# Patient Record
Sex: Female | Born: 1960 | Race: White | Hispanic: No | Marital: Married | Smoking: Current every day smoker
Health system: Southern US, Community
[De-identification: ages and names within clinical notes are randomized; demographics above are authoritative.]

## PROBLEM LIST (undated history)

## (undated) DIAGNOSIS — K219 Gastro-esophageal reflux disease without esophagitis: Secondary | ICD-10-CM

## (undated) DIAGNOSIS — K589 Irritable bowel syndrome without diarrhea: Secondary | ICD-10-CM

## (undated) DIAGNOSIS — I10 Essential (primary) hypertension: Secondary | ICD-10-CM

## (undated) DIAGNOSIS — K449 Diaphragmatic hernia without obstruction or gangrene: Secondary | ICD-10-CM

## (undated) HISTORY — PX: KNEE SURGERY: SHX244

## (undated) HISTORY — PX: FIBULA FRACTURE SURGERY: SHX947

## (undated) HISTORY — PX: ABDOMINAL HYSTERECTOMY: SHX81

---

## 2014-06-30 ENCOUNTER — Emergency Department (HOSPITAL_COMMUNITY): Payer: Medicare Other

## 2014-06-30 ENCOUNTER — Encounter (HOSPITAL_COMMUNITY): Payer: Self-pay

## 2014-06-30 ENCOUNTER — Emergency Department (HOSPITAL_COMMUNITY)
Admission: EM | Admit: 2014-06-30 | Discharge: 2014-06-30 | Disposition: A | Discharge status: Home / Self Care | Payer: Medicare Other | Attending: Emergency Medicine | Admitting: Emergency Medicine

## 2014-06-30 DIAGNOSIS — R110 Nausea: Secondary | ICD-10-CM | POA: Insufficient documentation

## 2014-06-30 DIAGNOSIS — Z7982 Long term (current) use of aspirin: Secondary | ICD-10-CM | POA: Diagnosis not present

## 2014-06-30 DIAGNOSIS — R1011 Right upper quadrant pain: Secondary | ICD-10-CM | POA: Insufficient documentation

## 2014-06-30 DIAGNOSIS — R1013 Epigastric pain: Secondary | ICD-10-CM | POA: Diagnosis present

## 2014-06-30 DIAGNOSIS — Z720 Tobacco use: Secondary | ICD-10-CM | POA: Diagnosis not present

## 2014-06-30 DIAGNOSIS — I10 Essential (primary) hypertension: Secondary | ICD-10-CM | POA: Diagnosis not present

## 2014-06-30 DIAGNOSIS — R197 Diarrhea, unspecified: Secondary | ICD-10-CM | POA: Insufficient documentation

## 2014-06-30 DIAGNOSIS — Z8719 Personal history of other diseases of the digestive system: Secondary | ICD-10-CM

## 2014-06-30 DIAGNOSIS — R109 Unspecified abdominal pain: Secondary | ICD-10-CM

## 2014-06-30 DIAGNOSIS — Z79899 Other long term (current) drug therapy: Secondary | ICD-10-CM | POA: Diagnosis not present

## 2014-06-30 DIAGNOSIS — Z90710 Acquired absence of both cervix and uterus: Secondary | ICD-10-CM | POA: Insufficient documentation

## 2014-06-30 HISTORY — DX: Gastro-esophageal reflux disease without esophagitis: K21.9

## 2014-06-30 HISTORY — DX: Diaphragmatic hernia without obstruction or gangrene: K44.9

## 2014-06-30 HISTORY — DX: Irritable bowel syndrome without diarrhea: K58.9

## 2014-06-30 HISTORY — DX: Essential (primary) hypertension: I10

## 2014-06-30 LAB — COMPREHENSIVE METABOLIC PANEL
ALK PHOS: 107 U/L (ref 39–117)
ALT: 42 U/L — ABNORMAL HIGH (ref 0–35)
AST: 32 U/L (ref 0–37)
Albumin: 3.7 g/dL (ref 3.5–5.2)
Anion gap: 6 (ref 5–15)
BUN: 15 mg/dL (ref 6–23)
CO2: 28 mmol/L (ref 19–32)
Calcium: 9.6 mg/dL (ref 8.4–10.5)
Chloride: 106 mmol/L (ref 96–112)
Creatinine, Ser: 0.65 mg/dL (ref 0.50–1.10)
GFR calc Af Amer: >90 mL/min (ref >90)
GFR calc non Af Amer: >90 mL/min (ref >90)
Glucose, Bld: 114 mg/dL — ABNORMAL HIGH (ref 70–99)
POTASSIUM: 3.8 mmol/L (ref 3.5–5.1)
SODIUM: 140 mmol/L (ref 135–145)
Total Bilirubin: 0.3 mg/dL (ref 0.3–1.2)
Total Protein: 6.7 g/dL (ref 6.0–8.3)

## 2014-06-30 LAB — CBC WITH DIFFERENTIAL/PLATELET
BASOS ABS: 0 10*3/uL (ref 0.0–0.1)
Basophils Relative: 0 % (ref 0–1)
EOS ABS: 0.2 10*3/uL (ref 0.0–0.7)
EOS PCT: 2 % (ref 0–5)
HCT: 44 % (ref 36.0–46.0)
HEMOGLOBIN: 14.6 g/dL (ref 12.0–15.0)
LYMPHS ABS: 2.6 10*3/uL (ref 0.7–4.0)
LYMPHS PCT: 24 % (ref 12–46)
MCH: 29.6 pg (ref 26.0–34.0)
MCHC: 33.2 g/dL (ref 30.0–36.0)
MCV: 89.1 fL (ref 78.0–100.0)
Monocytes Absolute: 1 10*3/uL (ref 0.1–1.0)
Monocytes Relative: 9 % (ref 3–12)
NEUTROS ABS: 7.1 10*3/uL (ref 1.7–7.7)
Neutrophils Relative %: 65 % (ref 43–77)
PLATELETS: 332 10*3/uL (ref 150–400)
RBC: 4.94 MIL/uL (ref 3.87–5.11)
RDW: 13.3 % (ref 11.5–15.5)
WBC: 10.9 10*3/uL — ABNORMAL HIGH (ref 4.0–10.5)

## 2014-06-30 LAB — URINALYSIS, ROUTINE W REFLEX MICROSCOPIC
Bilirubin Urine: NEGATIVE
GLUCOSE, UA: NEGATIVE mg/dL
Ketones, ur: NEGATIVE mg/dL
Leukocytes, UA: NEGATIVE
Nitrite: NEGATIVE
Protein, ur: NEGATIVE mg/dL
Urobilinogen, UA: 0.2 mg/dL (ref 0.0–1.0)
pH: 5.5 (ref 5.0–8.0)

## 2014-06-30 LAB — URINE MICROSCOPIC-ADD ON

## 2014-06-30 LAB — TROPONIN I

## 2014-06-30 LAB — LIPASE, BLOOD: LIPASE: 30 U/L (ref 11–59)

## 2014-06-30 MED ORDER — SODIUM CHLORIDE 0.45 % IV SOLN
INTRAVENOUS | Status: DC
  Administered 2014-06-30: 09:00:00 via INTRAVENOUS

## 2014-06-30 MED ORDER — ONDANSETRON HCL 8 MG PO TABS: 4.0000 mg | ORAL_TABLET | Freq: Three times a day (TID) | ORAL | Status: AC | PRN

## 2014-06-30 MED ORDER — GI COCKTAIL ~~LOC~~
ORAL | Status: AC
  Filled 2014-06-30: qty 30

## 2014-06-30 MED ORDER — GI COCKTAIL ~~LOC~~: 30.0000 mL | Freq: Once | ORAL | Status: DC

## 2014-06-30 MED ORDER — MORPHINE SULFATE 4 MG/ML IJ SOLN
4.0000 mg | Freq: Once | INTRAMUSCULAR | Status: AC
  Administered 2014-06-30: 4 mg via INTRAVENOUS
  Filled 2014-06-30: qty 1

## 2014-06-30 MED ORDER — FENTANYL CITRATE 0.05 MG/ML IJ SOLN
100.0000 ug | Freq: Once | INTRAMUSCULAR | Status: AC
  Administered 2014-06-30: 100 ug via INTRAVENOUS
  Filled 2014-06-30: qty 2

## 2014-06-30 MED ORDER — OXYCODONE-ACETAMINOPHEN 5-325 MG PO TABS: 1.0000 | ORAL_TABLET | ORAL | Status: AC | PRN

## 2014-06-30 MED ORDER — GI COCKTAIL ~~LOC~~
30.0000 mL | Freq: Once | ORAL | Status: AC
  Administered 2014-06-30: 30 mL via ORAL

## 2014-06-30 NOTE — ED Provider Notes (Signed)
Pt with epigastric pain onset at 4 AM - persistent, associated with excessive belching - no vomiting, no diarrhea, no CP - she states it feels different than her hiatal hernia in the past - tums and protonix without relief pta.  On exam has ttp in the epigstrium and RUQ c/w her pain - no reproducible CP and ECG is normal.  Check labs / lipase / CMP / Trop - GI cocktail.   EKG Interpretation  Date/Time:  Monday June 30 2014 08:26:50 EST Ventricular Rate:  76 PR Interval:  159 QRS Duration: 98 QT Interval:  403 QTC Calculation: 453 R Axis:   78 Text Interpretation:  Normal sinus rhythm Normal ECG No old tracing to compare Confirmed by Kischa Altice  MD, Adarrius Graeff ([id number]) on 06/30/2014 8:44:33 AM       Medical screening examination/treatment/procedure(s) were conducted as a shared visit with non-physician practitioner(s) and myself.  I personally evaluated the patient during the encounter.  Clinical Impression:   Final diagnoses:  Abdominal pain  H/O gastroesophageal reflux (GERD)         Vida RollerBrian D Ayeshia Coppin, MD 06/30/14 [phone]

## 2014-06-30 NOTE — ED Notes (Signed)
PA at bedside.

## 2014-06-30 NOTE — Discharge Instructions (Signed)
Abdominal Pain Many things can cause abdominal pain. Usually, abdominal pain is not caused by a disease and will improve without treatment. It can often be observed and treated at home. Your health care provider will do a physical exam and possibly order blood tests and X-rays to help determine the seriousness of your pain. However, in many cases, more time must pass before a clear cause of the pain can be found. Before that point, your health care provider may not know if you need more testing or further treatment. HOME CARE INSTRUCTIONS  Monitor your abdominal pain for any changes. The following actions may help to alleviate any discomfort you are experiencing:  Only take over-the-counter or prescription medicines as directed by your health care provider.  Do not take laxatives unless directed to do so by your health care provider.  Try a clear liquid diet (broth, tea, or water) as directed by your health care provider. Slowly move to a bland diet as tolerated. SEEK MEDICAL CARE IF:  You have unexplained abdominal pain.  You have abdominal pain associated with nausea or diarrhea.  You have pain when you urinate or have a bowel movement.  You experience abdominal pain that wakes you in the night.  You have abdominal pain that is worsened or improved by eating food.  You have abdominal pain that is worsened with eating fatty foods.  You have a fever. SEEK IMMEDIATE MEDICAL CARE IF:   Your pain does not go away within 2 hours.  You keep throwing up (vomiting).  Your pain is felt only in portions of the abdomen, such as the right side or the left lower portion of the abdomen.  You pass bloody or black tarry stools. MAKE SURE YOU:  Understand these instructions.   Will watch your condition.   Will get help right away if you are not doing well or get worse.  Document Released: 02/23/2005 Document Revised: 05/21/2013 Document Reviewed: 01/23/2013 [facility] Patient Information  2015 [place]. This information is not intended to replace advice given to you by your health care provider. Make sure you discuss any questions you have with your health care provider.  Use the medicines prescribed for your pain and nausea.  Do not drive within 4 hours of taking hydrocodone as it can make you drowsy.  Follow up with your doctor as planned.  As discussed,  You may benefit from having a HIDA scan study to rule out a dysfunctional gallbladder.  You do not have gallstones.  Your symptoms may also be due to increased gastritis/GERD from your recent antibiotic use.  Continue using your protonix and avoid any foods that worsen your symptoms.

## 2014-06-30 NOTE — ED Notes (Signed)
Pt c/o upper abd pain and pain under both breasts since 4 am.  Reports nausea, no vomiting.  Pt says has felt very tired for the past 2 weeks.   Has taken 6 tums and protonix this am without relief.

## 2014-06-30 NOTE — ED Provider Notes (Signed)
CSN: [id number]     Arrival date & time 06/30/14  0755 History   First MD Initiated Contact with Patient 06/30/14 0809     Chief Complaint  Patient presents with  . Abdominal Pain     (Consider location/radiation/quality/duration/timing/severity/associated sxs/prior Treatment) The history is provided by the patient and the spouse.   Charlotte Wilcox is a 54 y.o. female with a history significant for IBS, GERD, fatty liver and hiatal hernia presenting with epigastric to right upper quadrant sharp pain and pressure sensation with radiation into her right mid back which woke her at 3 AM today.  She endorses nausea without emesis.  Denies fevers or chills but did have fevers within the past week associated with an acute bronchitis for which she completed a Z-Pak 2 days ago.  She also endorses 3-4 episodes of nonbloody diarrhea this morning, but states this is normal for her IBS.  Her pain is constant, worsened with palpation, not worsened or improved with movement or positional changes.  She denies shortness of breath, but states her pain is worsened with deep inspiration. Also denies dysuria, flank pain, lower abdominal pain, palpitations, diaphoresis.  She has had no medications for her pain prior to arrival.  She reports a similar episode of symptoms 6 months ago at which time she was admitted to [facility] and she states her blood work testing for gallstones and cardiac disease were negative.  She does not recall if she had any imaging studies during this admission.  She has taken her protonix this am and also took 6 tums without relief.  Additionally she mentions completing a course of Flagyl 2 days ago for giardia exposure.     Past Medical History  Diagnosis Date  . IBS (irritable bowel syndrome)   . Hypertension   . GERD (gastroesophageal reflux disease)   . Hiatal hernia    Past Surgical History  Procedure Laterality Date  . Abdominal hysterectomy    . Knee surgery    . Fibula  fracture surgery     No family history on file. History  Substance Use Topics  . Smoking status: Current Every Day Smoker  . Smokeless tobacco: Not on file  . Alcohol Use: No   OB History    No data available     Review of Systems  Constitutional: Negative for fever.  HENT: Negative for congestion and sore throat.   Eyes: Negative.   Respiratory: Negative for chest tightness and shortness of breath.   Cardiovascular: Negative for chest pain and palpitations.  Gastrointestinal: Positive for nausea, abdominal pain and diarrhea. Negative for vomiting and constipation.  Genitourinary: Negative.  Negative for dysuria.  Musculoskeletal: Negative for joint swelling, arthralgias and neck pain.  Skin: Negative.  Negative for rash and wound.  Neurological: Negative for dizziness, weakness, light-headedness, numbness and headaches.  Psychiatric/Behavioral: Negative.       Allergies  Sulfa antibiotics and Saline  Home Medications   Prior to Admission medications   Medication Sig Start Date End Date Taking? Authorizing Provider  ALPRAZolam Prudy Feeler) 0.5 MG tablet Take 1 tablet by mouth daily as needed for anxiety or sleep.  06/24/14  Yes Historical Provider, MD  aspirin EC 81 MG tablet Take 81 mg by mouth daily.   Yes Historical Provider, MD  atorvastatin (LIPITOR) 80 MG tablet Take 1 tablet by mouth daily. 06/01/14  Yes Historical Provider, MD  calcium carbonate (TUMS - DOSED IN MG ELEMENTAL CALCIUM) 500 MG chewable tablet Chew 5 tablets by mouth  daily as needed for indigestion or heartburn.   Yes Historical Provider, MD  Cholecalciferol (VITAMIN D3) 2000 UNITS capsule Take 1 capsule by mouth daily. 05/05/14  Yes Historical Provider, MD  cyclobenzaprine (FLEXERIL) 10 MG tablet Take 1 tablet by mouth daily as needed for muscle spasms.  04/30/14  Yes Historical Provider, MD  dicyclomine (BENTYL) 20 MG tablet Take 1 tablet by mouth daily as needed for spasms.  04/30/14  Yes Historical Provider, MD   FLUoxetine (PROZAC) 40 MG capsule Take 2 capsules by mouth daily. 06/01/14  Yes Historical Provider, MD  gabapentin (NEURONTIN) 300 MG capsule Take 5 capsules by mouth at bedtime. 06/01/14  Yes Historical Provider, MD  pantoprazole (PROTONIX) 40 MG tablet Take 1 tablet by mouth daily. 06/01/14  Yes Historical Provider, MD  promethazine-dextromethorphan (PROMETHAZINE-DM) 6.25-15 MG/5ML syrup Take 5 mLs by mouth 3 (three) times daily as needed for cough.  06/24/14  Yes Historical Provider, MD  rOPINIRole (REQUIP) 2 MG tablet Take 1 tablet by mouth daily. 06/01/14  Yes Historical Provider, MD  ropinirole (REQUIP) 5 MG tablet Take 1 tablet by mouth at bedtime. 06/01/14  Yes Historical Provider, MD  triamterene-hydrochlorothiazide (MAXZIDE-25) 37.5-25 MG per tablet Take 1 tablet by mouth daily. 05/01/14  Yes Historical Provider, MD  vitamin B-12 (CYANOCOBALAMIN) 1000 MCG tablet Take 1 tablet by mouth daily. 05/05/14  Yes Historical Provider, MD  vitamin C (ASCORBIC ACID) 500 MG tablet Take 1 tablet by mouth daily. 05/05/14  Yes Historical Provider, MD  ondansetron (ZOFRAN) 8 MG tablet Take 0.5 tablets (4 mg total) by mouth every 8 (eight) hours as needed for nausea or vomiting. 06/30/14   Burgess AmorJulie December Hedtke, PA-C  oxyCODONE-acetaminophen (PERCOCET/ROXICET) 5-325 MG per tablet Take 1 tablet by mouth every 4 (four) hours as needed. 06/30/14   Burgess AmorJulie Zacari Radick, PA-C   BP 115/59 mmHg  Pulse 67  Temp(Src) 97.9 F (36.6 C) (Oral)  Resp 14  Ht 5\' 3"  (1.6 m)  Wt 270 lb (122.471 kg)  BMI 47.84 kg/m2  SpO2 96% Physical Exam  Constitutional: She appears well-developed and well-nourished.  Morbidly obese  HENT:  Head: Normocephalic and atraumatic.  Eyes: Conjunctivae are normal.  Neck: Normal range of motion.  Cardiovascular: Normal rate, regular rhythm, normal heart sounds and intact distal pulses.   Pulmonary/Chest: Effort normal and breath sounds normal. She has no wheezes. She exhibits no tenderness.  Abdominal: Soft. Bowel  sounds are normal. She exhibits no mass. There is tenderness in the right upper quadrant and epigastric area. There is positive Murphy's sign. There is no rebound.  Musculoskeletal: Normal range of motion.  Neurological: She is alert.  Skin: Skin is warm and dry.  Psychiatric: She has a normal mood and affect.  Nursing note and vitals reviewed.   ED Course  Procedures (including critical care time) Labs Review Labs Reviewed  COMPREHENSIVE METABOLIC PANEL - Abnormal; Notable for the following:    Glucose, Bld 114 (*)    ALT 42 (*)    All other components within normal limits  CBC WITH DIFFERENTIAL/PLATELET - Abnormal; Notable for the following:    WBC 10.9 (*)    All other components within normal limits  URINALYSIS, ROUTINE W REFLEX MICROSCOPIC - Abnormal; Notable for the following:    Specific Gravity, Urine >1.030 (*)    Hgb urine dipstick MODERATE (*)    All other components within normal limits  URINE MICROSCOPIC-ADD ON - Abnormal; Notable for the following:    Squamous Epithelial / LPF MANY (*)  Bacteria, UA MANY (*)    All other components within normal limits  LIPASE, BLOOD  TROPONIN I    Imaging Review US Abdomen Limited  06/30/2014   CLINICAL DATA:  Acute onset upper abdominal pain starting at 4 a.m.  EXAM: US ABDOMEN LIMITED - RIGHT UPPER QUADRANT  COMPARISON:  None.  FINDINGS: Gallbladder:  No gallstones or wall thickening visualized. No sonographic Murphy sign noted.  Common bile duct:  Diameter: 4 mm where visualized  Liver:  Liver is prominent in size, 19 cm in maximal craniocaudal span. There is poor acoustic penetration of the liver due to patient size and possible hepatic steatosis (the liver is echogenic relative to the right kidney and there is poor demonstration of portal triads). No focal lesions identified. There is antegrade flow in the imaged hepatic and portal venous system.  IMPRESSION: 1. Negative gallbladder. 2. Limited evaluation of the liver due to  patient body habitus and probable steatosis.   Electronically Signed   By: Tiburcio Pea M.D.   On: 06/30/2014 09:51   Dg Abd Acute W/chest  06/30/2014   CLINICAL DATA:  Right upper quadrant pain and nausea since 4 a.m. this morning.  EXAM: ACUTE ABDOMEN SERIES (ABDOMEN 2 VIEW & CHEST 1 VIEW)  COMPARISON:  None.  FINDINGS: Single view of the chest demonstrates clear lungs and normal heart size. No pneumothorax or pleural effusion.  Two views of the abdomen show no free intraperitoneal air. A few nondilated loops of gas-filled small bowel are seen in the left abdomen. The bowel gas pattern is nonobstructive. No abnormal abdominal calcification is seen.  IMPRESSION: No acute finding chest or abdomen.   Electronically Signed   By: Drusilla Kanner M.D.   On: 06/30/2014 10:46     EKG Interpretation   Date/Time:  Monday June 30 2014 08:26:50 EST Ventricular Rate:  76 PR Interval:  159 QRS Duration: 98 QT Interval:  403 QTC Calculation: 453 R Axis:   78 Text Interpretation:  Normal sinus rhythm Normal ECG No old tracing to  compare Confirmed by MILLER  MD, BRIAN ([id number]) on 06/30/2014 8:44:33 AM      MDM   Final diagnoses:  Abdominal pain  H/O gastroesophageal reflux (GERD)    Patients labs and/or radiological studies were viewed and considered during the medical decision making and disposition process. Pt was given GI cocktail without relief of symptoms.  She was also given morphine per IV with pain reduction from 10 out of 10-8 out of 10.  She describes continued pressure like a balloon that's been squeezed in her epigastric area.  Will send for acute abdominal series to make sure this patient has normal bowel gas pattern.  Fentanyl ordered for pain relief.  Nausea resolved.  This is probably increase in a GERD/peptic ulcer disease, possibly exacerbated by recent rounds of antibiotics.  Discussed with patient possible need for HIDA scan to definitively rule out gallbladder disease which  she can discuss with her PCP.  She will be prescribed pain medication and nausea medication at discharge.    Burgess Amor, PA-C 06/30/14 1055  Vida Roller, MD 06/30/14 [phone]

## 2015-06-13 IMAGING — DX DG ABDOMEN ACUTE W/ 1V CHEST
4 series · 4 of 4 positions shown · non-contrast
Comparison: None.

CLINICAL DATA: Right upper quadrant pain and nausea since 4 a.m.
this morning.

EXAM:
ACUTE ABDOMEN SERIES (ABDOMEN 2 VIEW & CHEST 1 VIEW)

[chest pa]
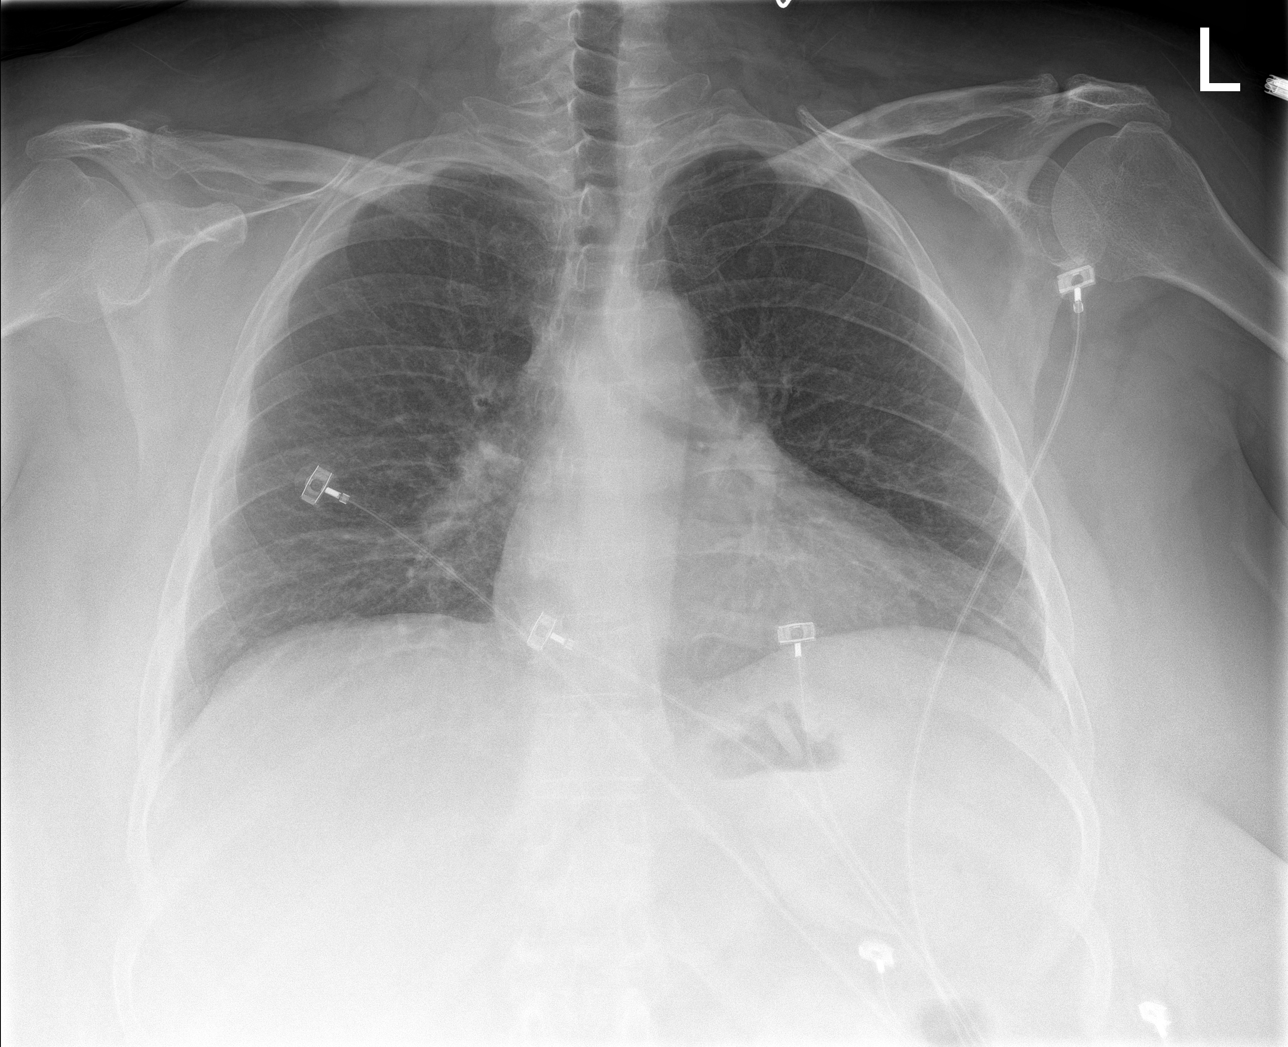

[abdomen erect]
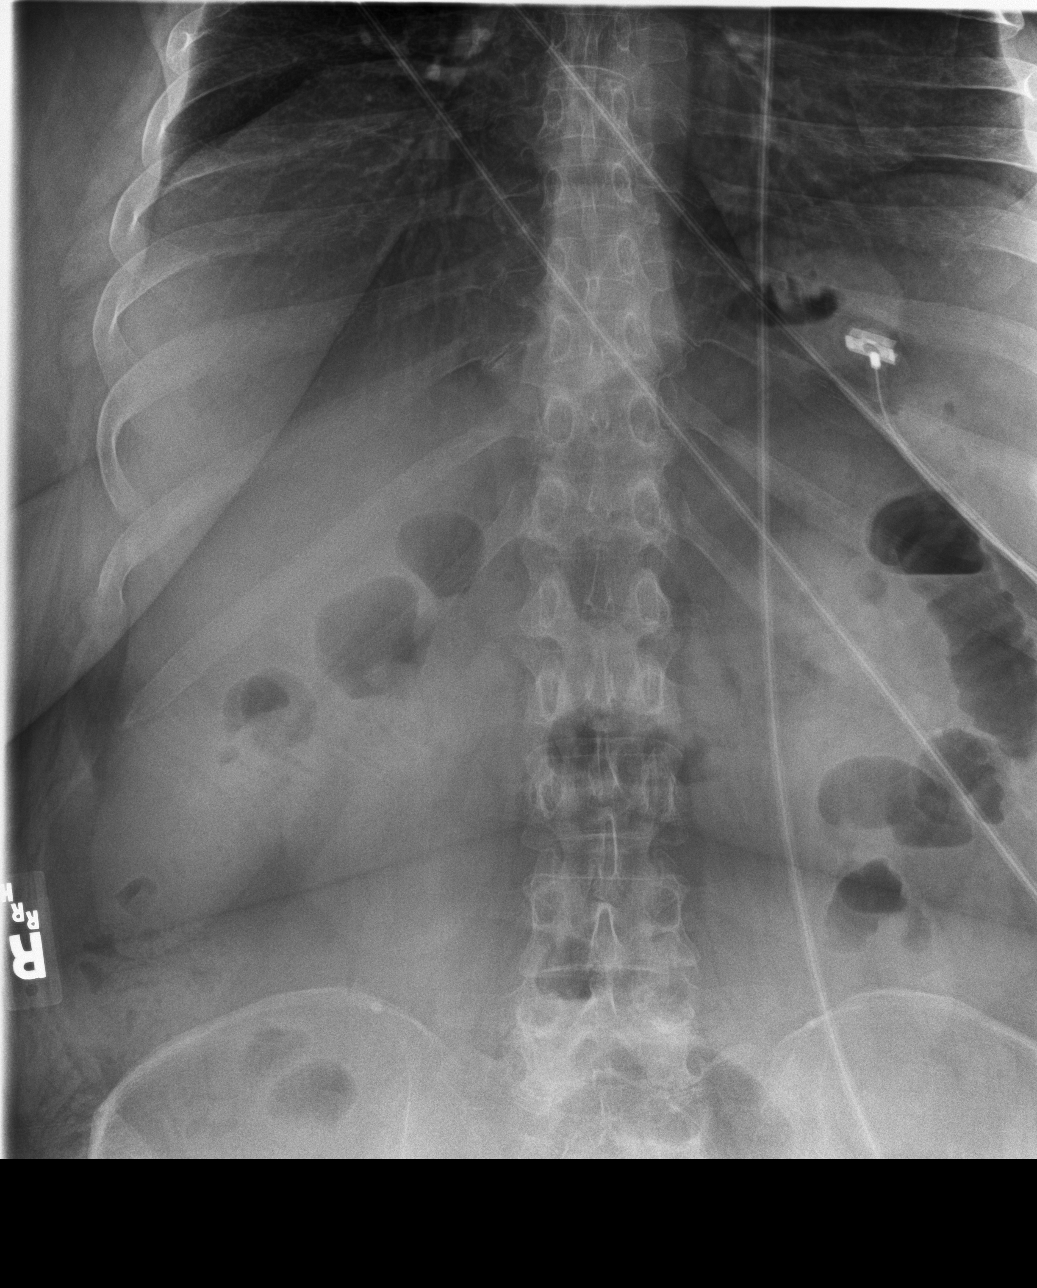

[abdomen supine (1 of 2)]
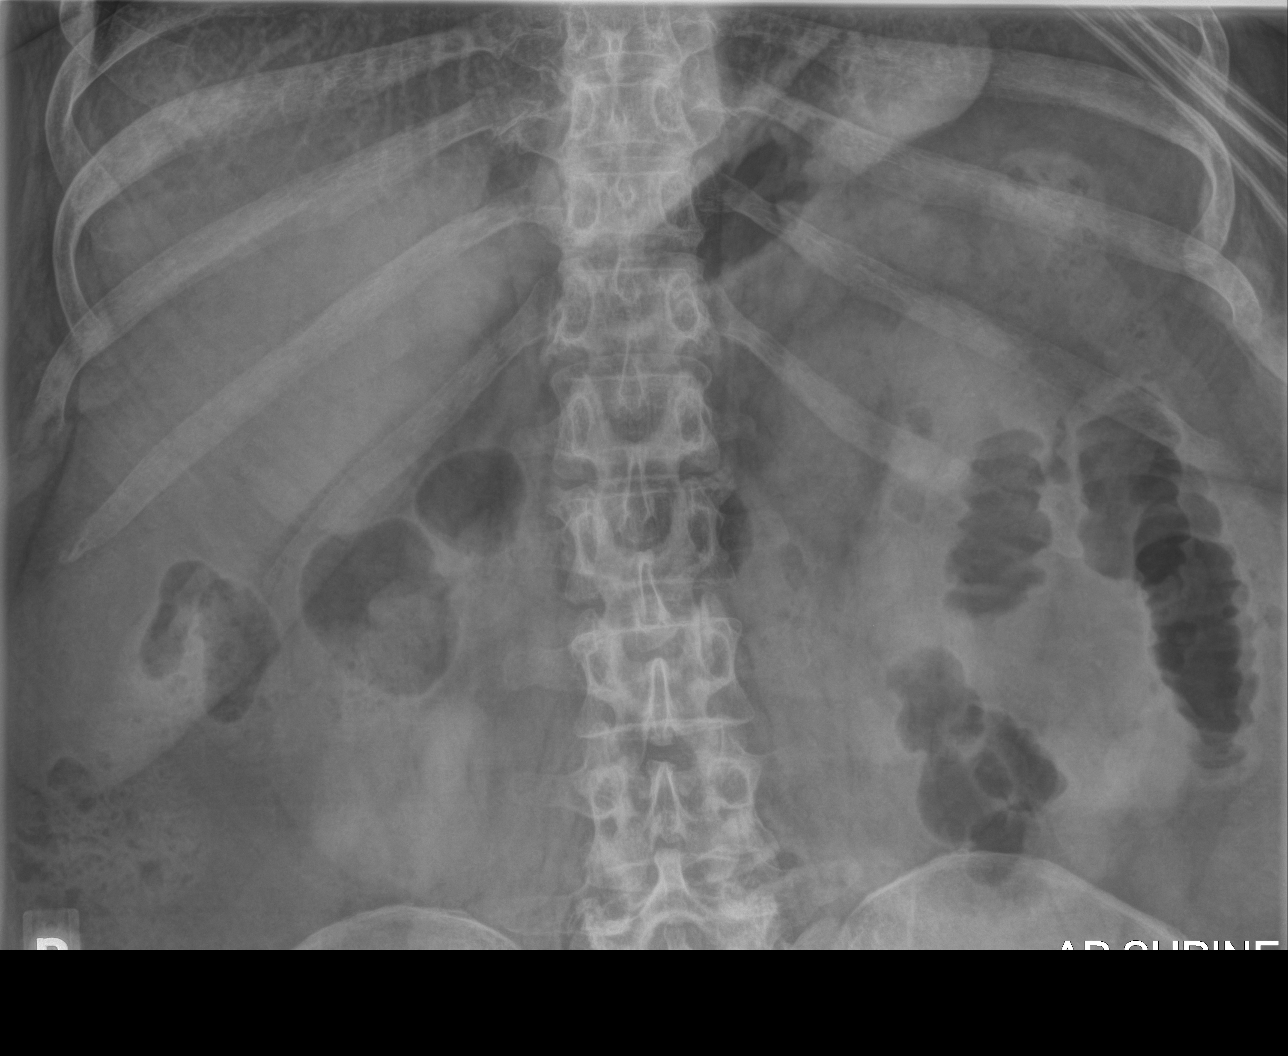

[abdomen supine (2 of 2)]
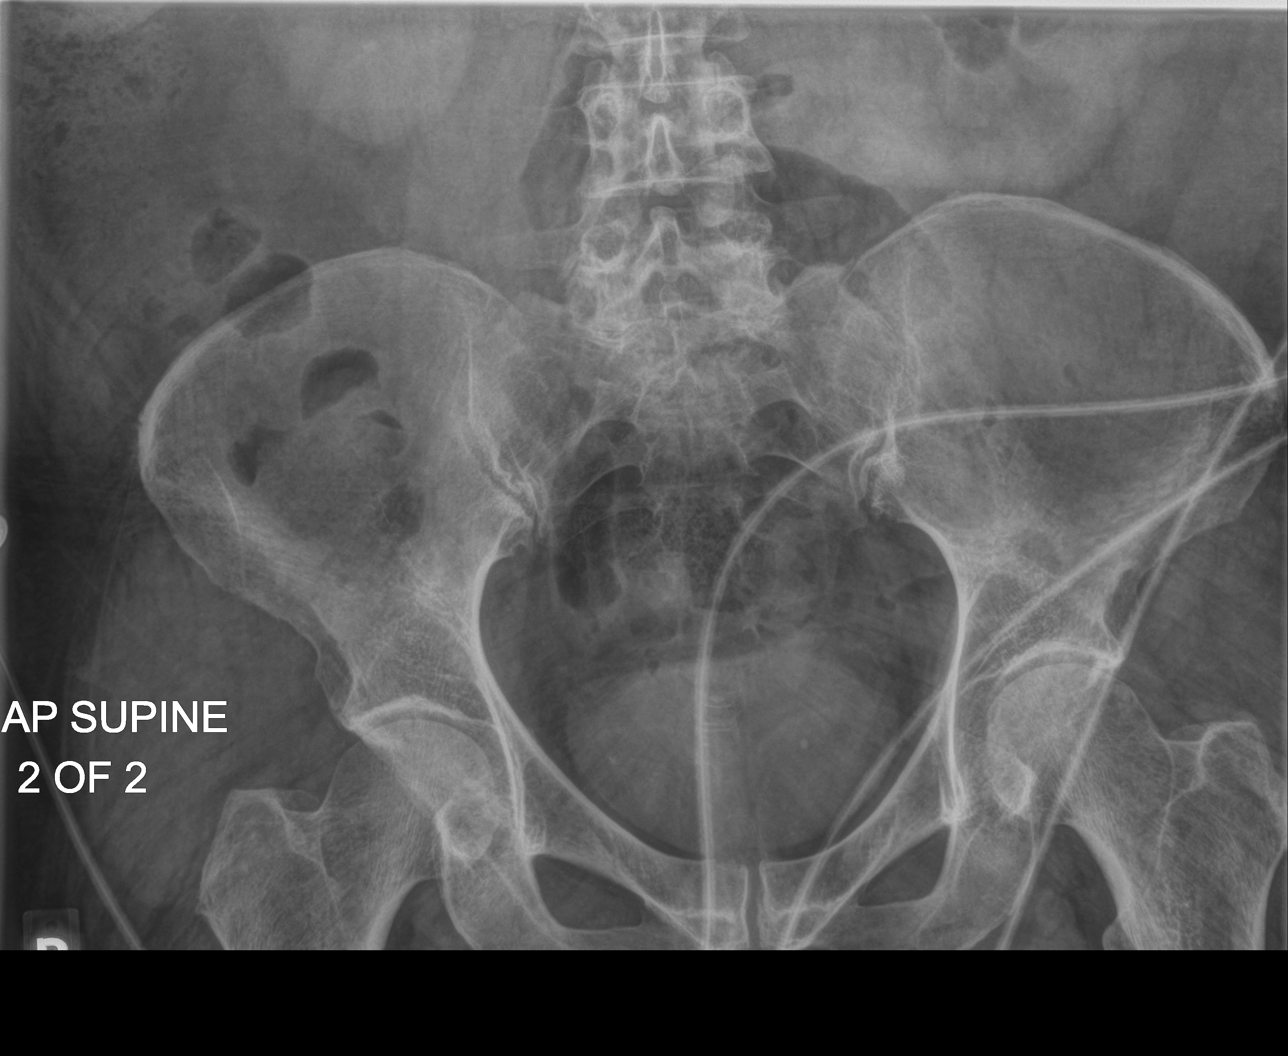

[4 of 4 positions shown; findings below may reference images not displayed]

FINDINGS: Single view of the chest demonstrates clear lungs and normal heart
size. No pneumothorax or pleural effusion.

Two views of the abdomen show no free intraperitoneal air. A few
nondilated loops of gas-filled small bowel are seen in the left
abdomen. The bowel gas pattern is nonobstructive. No abnormal
abdominal calcification is seen.
IMPRESSION: No acute finding chest or abdomen.

## 2021-02-02 ENCOUNTER — Other Ambulatory Visit: Payer: Self-pay

## 2021-02-02 ENCOUNTER — Encounter (HOSPITAL_COMMUNITY): Payer: Self-pay | Admitting: *Deleted

## 2021-02-02 ENCOUNTER — Emergency Department (HOSPITAL_COMMUNITY)
Admission: EM | Admit: 2021-02-02 | Discharge: 2021-02-02 | Disposition: A | Discharge status: Home / Self Care | Payer: Medicare Other | Attending: Emergency Medicine | Admitting: Emergency Medicine

## 2021-02-02 DIAGNOSIS — Z79899 Other long term (current) drug therapy: Secondary | ICD-10-CM | POA: Diagnosis not present

## 2021-02-02 DIAGNOSIS — I10 Essential (primary) hypertension: Secondary | ICD-10-CM | POA: Insufficient documentation

## 2021-02-02 DIAGNOSIS — Z7982 Long term (current) use of aspirin: Secondary | ICD-10-CM | POA: Insufficient documentation

## 2021-02-02 DIAGNOSIS — F17200 Nicotine dependence, unspecified, uncomplicated: Secondary | ICD-10-CM | POA: Diagnosis not present

## 2021-02-02 DIAGNOSIS — R300 Dysuria: Secondary | ICD-10-CM | POA: Insufficient documentation

## 2021-02-02 LAB — URINALYSIS, ROUTINE W REFLEX MICROSCOPIC
Bilirubin Urine: NEGATIVE
Glucose, UA: NEGATIVE mg/dL
Ketones, ur: NEGATIVE mg/dL
Leukocytes,Ua: NEGATIVE
Nitrite: NEGATIVE
Protein, ur: NEGATIVE mg/dL
Specific Gravity, Urine: >1.03 — ABNORMAL HIGH (ref 1.005–1.030)
pH: 5.5 (ref 5.0–8.0)

## 2021-02-02 LAB — URINALYSIS, MICROSCOPIC (REFLEX)

## 2021-02-02 NOTE — ED Provider Notes (Signed)
[facility] EMERGENCY DEPARTMENT Provider Note   CSN: [id number] Arrival date & time: 02/02/21  1355     History Chief Complaint  Patient presents with   Recurrent UTI    Charlotte Wilcox is a 60 y.o. female.  Pt reports she has had multiple uti's in the past 6 months.  Pt reports she is unable to see Urology until 9/20.  Pt reports she has stabbing pain in her bladder.  Pt treated at [facility] with Fosfomycin on 9/1.  Pt has a culture report that shows muliple organism in ua.    The history is provided by the patient. No language interpreter was used.  Dysuria Pain quality:  Aching and sharp Pain severity:  Moderate Onset quality:  Gradual Timing:  Constant Progression:  Worsening Recent urinary tract infections: no   Relieved by:  Nothing Worsened by:  Nothing Ineffective treatments:  None tried Urinary symptoms: no discolored urine   Associated symptoms: no abdominal pain   Risk factors: recurrent urinary tract infections       Past Medical History:  Diagnosis Date   GERD (gastroesophageal reflux disease)    Hiatal hernia    Hypertension    IBS (irritable bowel syndrome)     There are no problems to display for this patient.   Past Surgical History:  Procedure Laterality Date   ABDOMINAL HYSTERECTOMY     FIBULA FRACTURE SURGERY     KNEE SURGERY       OB History   No obstetric history on file.     No family history on file.  Social History   Tobacco Use   Smoking status: Every Day  Substance Use Topics   Alcohol use: No   Drug use: Yes    Types: Marijuana    Comment: yesterday    Home Medications Prior to Admission medications   Medication Sig Start Date End Date Taking? Authorizing Provider  ALPRAZolam Prudy Feeler) 0.5 MG tablet Take 1 tablet by mouth daily as needed for anxiety or sleep.  06/24/14   [provider]  aspirin EC 81 MG tablet Take 81 mg by mouth daily.    [provider]  atorvastatin (LIPITOR) 80 MG tablet Take 1 tablet by  mouth daily. 06/01/14   [provider]  calcium carbonate (TUMS - DOSED IN MG ELEMENTAL CALCIUM) 500 MG chewable tablet Chew 5 tablets by mouth daily as needed for indigestion or heartburn.    [provider]  Cholecalciferol (VITAMIN D3) 2000 UNITS capsule Take 1 capsule by mouth daily. 05/05/14   [provider]  cyclobenzaprine (FLEXERIL) 10 MG tablet Take 1 tablet by mouth daily as needed for muscle spasms.  04/30/14   [provider]  dicyclomine (BENTYL) 20 MG tablet Take 1 tablet by mouth daily as needed for spasms.  04/30/14   [provider]  FLUoxetine (PROZAC) 40 MG capsule Take 2 capsules by mouth daily. 06/01/14   [provider]  gabapentin (NEURONTIN) 300 MG capsule Take 5 capsules by mouth at bedtime. 06/01/14   [provider]  ondansetron (ZOFRAN) 8 MG tablet Take 0.5 tablets (4 mg total) by mouth every 8 (eight) hours as needed for nausea or vomiting. 06/30/14   Burgess Amor, PA-C  oxyCODONE-acetaminophen (PERCOCET/ROXICET) 5-325 MG per tablet Take 1 tablet by mouth every 4 (four) hours as needed. 06/30/14   Burgess Amor, PA-C  pantoprazole (PROTONIX) 40 MG tablet Take 1 tablet by mouth daily. 06/01/14   [provider]  promethazine-dextromethorphan (PROMETHAZINE-DM)  6.25-15 MG/5ML syrup Take 5 mLs by mouth 3 (three) times daily as needed for cough.  06/24/14   [provider]  rOPINIRole (REQUIP) 2 MG tablet Take 1 tablet by mouth daily. 06/01/14   [provider]  ropinirole (REQUIP) 5 MG tablet Take 1 tablet by mouth at bedtime. 06/01/14   [provider]  triamterene-hydrochlorothiazide (MAXZIDE-25) 37.5-25 MG per tablet Take 1 tablet by mouth daily. 05/01/14   [provider]  vitamin B-12 (CYANOCOBALAMIN) 1000 MCG tablet Take 1 tablet by mouth daily. 05/05/14   [provider]  vitamin C (ASCORBIC ACID) 500 MG tablet Take 1 tablet by mouth daily. 05/05/14   [provider]     Allergies    Sulfa antibiotics and Saline  Review of Systems   Review of Systems  Gastrointestinal:  Negative for abdominal pain.  Genitourinary:  Positive for dysuria.  All other systems reviewed and are negative.  Physical Exam Updated Vital Signs BP 92/71 (BP Location: Right Arm)   Pulse 68   Temp 97.9 F (36.6 C)   Resp 17   Ht 5\' 3"  (1.6 m)   Wt 83.9 kg   SpO2 96%   BMI 32.77 kg/m   Physical Exam Vitals reviewed.  Constitutional:      Appearance: Normal appearance.  Cardiovascular:     Rate and Rhythm: Normal rate.  Pulmonary:     Effort: Pulmonary effort is normal.  Abdominal:     General: Abdomen is flat.  Musculoskeletal:        General: Normal range of motion.  Skin:    General: Skin is warm.  Neurological:     General: No focal deficit present.     Mental Status: She is alert.  Psychiatric:        Mood and Affect: Mood normal.    ED Results / Procedures / Treatments   Labs (all labs ordered are listed, but only abnormal results are displayed) Labs Reviewed  URINALYSIS, ROUTINE W REFLEX MICROSCOPIC - Abnormal; Notable for the following components:      Result Value   Specific Gravity, Urine >1.030 (*)    Hgb urine dipstick SMALL (*)    All other components within normal limits  URINALYSIS, MICROSCOPIC (REFLEX) - Abnormal; Notable for the following components:   Bacteria, UA RARE (*)    All other components within normal limits  URINE CULTURE    EKG None  Radiology No results found.  Procedures Procedures   Medications Ordered in ED Medications - No data to display  ED Course  I have reviewed the triage vital signs and the nursing notes.  Pertinent labs & imaging results that were available during my care of the patient were reviewed by me and considered in my medical decision making (see chart for details).    MDM Rules/Calculators/A&P                           MDM:  Pt given number for [facility].  Pt advised to  call to see if she can be seen sooner  I will culture urine obtained today.   Final Clinical Impression(s) / ED Diagnoses Final diagnoses:  Dysuria    Rx / DC Orders ED Discharge Orders     None     An After Visit Summary was printed and given to the patient.    02/02/21 1830    04/04/21, MD 02/02/21  2311  

## 2021-02-02 NOTE — Discharge Instructions (Addendum)
Schedule to see Urology for evaluation  Urine culture is pending

## 2021-02-02 NOTE — ED Triage Notes (Signed)
States she has been treated for multiple UTI'S RECENTLY without relief

## 2021-02-04 LAB — URINE CULTURE: Special Requests: NORMAL
